# Patient Record
Sex: Female | Born: 1995 | Race: White | Hispanic: No | Marital: Single | Smoking: Never smoker
Health system: Southern US, Community
[De-identification: ages and names within clinical notes are randomized; demographics above are authoritative.]

## PROBLEM LIST (undated history)

## (undated) HISTORY — PX: ANKLE SURGERY: SHX546

## (undated) HISTORY — PX: ELBOW SURGERY: SHX618

---
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
## 2019-09-02 ENCOUNTER — Ambulatory Visit: Payer: BC Managed Care – PPO | Admitting: Osteopathic Medicine

## 2019-09-02 ENCOUNTER — Other Ambulatory Visit: Payer: Self-pay

## 2019-09-02 VITALS — BP 125/82 | HR 99 | Temp 98.4°F | Wt 164.0 lb

## 2019-09-02 DIAGNOSIS — Z111 Encounter for screening for respiratory tuberculosis: Secondary | ICD-10-CM | POA: Diagnosis not present

## 2019-09-02 NOTE — Progress Notes (Signed)
Pt in office today to have PPD placement. PPD was placed on right forearm. Lot number [id number] [place] [id number] exp :July 28,2022. Pt will have read at [facility] on Friday.

## 2020-01-06 ENCOUNTER — Telehealth: Payer: Self-pay | Admitting: Medical-Surgical

## 2020-01-06 NOTE — Telephone Encounter (Signed)
Patient due for her first pap smear according to our records. Please contact to see if she has had this done already. If so, please respond with date and provider that completed it. If not, please offer to schedule appointment for pap smear (and annual physical exam if needed) to be done.

## 2020-01-06 NOTE — Telephone Encounter (Signed)
01/06/2020  Attempted to contact pt but phone rang a fast busy signal.  Tiajuana Amass, CMA

## 2020-02-05 NOTE — Telephone Encounter (Signed)
Patient very past due for physical.   Can we call and get her scheduled for physical with pap, please?
